# Patient Record
Sex: Male | Born: 1980
Health system: Southern US, Community
[De-identification: ages and names within clinical notes are randomized; demographics above are authoritative.]

---

## 2005-02-05 ENCOUNTER — Emergency Department (HOSPITAL_COMMUNITY): Admission: EM | Admit: 2005-02-05 | Discharge: 2005-02-05 | Payer: Self-pay | Admitting: Emergency Medicine
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
## 2011-04-13 ENCOUNTER — Encounter: Payer: Self-pay | Admitting: Vascular Surgery

## 2011-04-14 ENCOUNTER — Ambulatory Visit (INDEPENDENT_AMBULATORY_CARE_PROVIDER_SITE_OTHER): Payer: BC Managed Care – PPO | Admitting: *Deleted

## 2011-04-14 ENCOUNTER — Ambulatory Visit (INDEPENDENT_AMBULATORY_CARE_PROVIDER_SITE_OTHER): Payer: BC Managed Care – PPO | Admitting: Vascular Surgery

## 2011-04-14 ENCOUNTER — Encounter: Payer: Self-pay | Admitting: Vascular Surgery

## 2011-04-14 VITALS — BP 117/76 | HR 67 | Resp 16 | Ht 70.0 in | Wt 169.0 lb

## 2011-04-14 DIAGNOSIS — M7989 Other specified soft tissue disorders: Secondary | ICD-10-CM

## 2011-04-14 DIAGNOSIS — M79609 Pain in unspecified limb: Secondary | ICD-10-CM

## 2011-04-14 DIAGNOSIS — I83893 Varicose veins of bilateral lower extremities with other complications: Secondary | ICD-10-CM | POA: Insufficient documentation

## 2011-04-14 NOTE — Progress Notes (Signed)
Subjective:     Patient ID: John Bush, male   DOB: 11-Jul-1980, 31 y.o.   MRN: [id number]  HPI is 31 year old healthy male has noticed bulging varicosities in his left calf over the last 3-4 years which have increased in size and become increasingly symptomatic. He describes aching, throbbing, burning, and staining discomfort in the left calf which worsens as the day progresses. It is not wear elastic compression stockings. He has noticed progressive swelling in the left calf compared to the right side over the past few years. He has no history of DVT, thrombophlebitis, stasis ulcers, bleeding, or other complications. He has no symptoms in the contralateral right leg. He does not take pain medication. His job requires him to be on his feet all day and his symptoms continued to worsen since he is unable to elevate his legs.  History reviewed. No pertinent past medical history.  History  Substance Use Topics  . Smoking status: Never Smoker   . Smokeless tobacco: Not on file  . Alcohol Use: Yes    Family History  Problem Relation Age of Onset  . Hyperlipidemia Father   . Hypertension Father     Not on File  No current outpatient prescriptions on file.  BP 117/76  Pulse 67  Resp 16  Ht 5\' 10"  (1.778 m)  Wt 169 lb (76.658 kg)  BMI 24.25 kg/m2  SpO2 100%  Body mass index is 24.25 kg/(m^2).          Review of Systems denies chest pain, dyspnea on exertion, PND, orthopnea, hemoptysis, claudication, all neurologic symptoms-complete negative review of systems     Objective:   Physical Exam pressure 170/76 heart rate 67 respirations 16 Gen.-alert and oriented x3 in no apparent distress HEENT normal for age Lungs no rhonchi or wheezing Cardiovascular regular rhythm no murmurs carotid pulses 3+ palpable no bruits audible Abdomen soft nontender no palpable masses Musculoskeletal free of  major deformities Skin clear -no rashes Neurologic normal Lower extremities 3+ femoral and  dorsalis pedis pulses palpable bilaterally with no edema in right leg. Left calf 1/2 cm larger in circumference than right calf. Bulging varicosities in left medial calf between the knee and ankle extending down distally to a point to 6 cm proximal to the medial malleolus. No active ulceration is noted. There is no hyperpigmentation noted.  Today I ordered a venous duplex exam of the left leg which are reviewed and interpreted. There is some reflux in the deep system which is mild. There is no DVT. There is gross reflux in the great saphenous system supplying these varicosities mentioned above. This extends from the greater saphenous junction to the knee.      Assessment:     Severe venous insufficiency left leg with reflux left great saphenous system supplying symptomatic varicosities in the left calf and distal leg which are affecting the patient's daily living and ability to work because of increased symptoms    Plan:     #1 long-leg elastic compression stockings 20-30 mm gradient #2 elevate legs as much as possible-will not be possible during daytime hours while at work #3 ibuprofen on a daily basis #4 return in 3 months if there's been no improvement feel patient should be treated with #1 laser ablation left great saphenous vein with 10-20 stab phlebectomy

## 2011-04-21 NOTE — Procedures (Unsigned)
LOWER EXTREMITY VENOUS REFLUX EXAM  INDICATION:  Left lower extremity pain.  EXAM:  Using color-flow imaging and pulse Doppler spectral analysis, the left common femoral, femoral, popliteal, posterior tibial, great and small saphenous veins were evaluated.  There is evidence suggesting deep venous Reflux of >528milliseconds noted throughout the left lower extremity.  The left saphenofemoral junction and non-tortuous left great saphenous vein demonstrate Reflux of >562milliseconds.  The left proximal small saphenous vein demonstrates competency.  GSV Diameter (used if found to be incompetent only)                                           Right    Left Proximal Greater Saphenous Vein           cm       0.51 cm Proximal-to-mid-thigh                     cm       cm Mid thigh                                 cm       0.42 cm Mid-distal thigh                          cm       0.44 cm Distal thigh                              cm       cm Knee                                      cm       0.29 cm  IMPRESSION:  Reflux of >573milliseconds noted throughout the left lower extremity deep venous system and the left great saphenous vein.  ___________________________________________ Quita Skye Hart Rochester, M.D.  CH/MEDQ  D:  04/16/2011  T:  04/16/2011  Job:  [id number]

## 2011-07-10 ENCOUNTER — Encounter: Payer: Self-pay | Admitting: Vascular Surgery

## 2011-07-13 ENCOUNTER — Ambulatory Visit (INDEPENDENT_AMBULATORY_CARE_PROVIDER_SITE_OTHER): Payer: BC Managed Care – PPO | Admitting: Vascular Surgery

## 2011-07-13 ENCOUNTER — Encounter: Payer: Self-pay | Admitting: Vascular Surgery

## 2011-07-13 VITALS — BP 146/79 | HR 67 | Resp 18 | Ht 70.0 in | Wt 165.0 lb

## 2011-07-13 DIAGNOSIS — I83893 Varicose veins of bilateral lower extremities with other complications: Secondary | ICD-10-CM

## 2011-07-13 NOTE — Progress Notes (Signed)
Subjective:     Patient ID: John Bush, male   DOB: [birth year], 31 y.o.   MRN: [id number]  HPI this 31 year old healthy male returns today with persistent symptoms of aching throbbing and burning discomfort in his left leg due to his venous insufficiency. He has demonstrated gross reflux in the left great saphenous vein with enlarging bulging varicosities in the calf both proximally and distally. He also has chronic edema in the left ankle. He has no history of DVT or thrombophlebitis or bleeding or stasis ulcers. He has been wearing long leg elastic compression stockings 20-30 mm gradient as well as trying elevation and ibuprofen with no success. Symptoms are affecting his daily living. No past medical history on file.  History  Substance Use Topics  . Smoking status: Never Smoker   . Smokeless tobacco: Not on file  . Alcohol Use: Yes    Family History  Problem Relation Age of Onset  . Hyperlipidemia Father   . Hypertension Father     No Known Allergies  Current outpatient prescriptions:minocycline (MINOCIN,DYNACIN) 100 MG capsule, Take 100 mg by mouth 2 (two) times daily., Disp: , Rfl:   BP 146/79  Pulse 67  Resp 18  Ht 5\' 10"  (1.778 m)  Wt 165 lb (74.844 kg)  BMI 23.68 kg/m2  Body mass index is 23.68 kg/(m^2).           Review of Systems denies chest pain, dyspnea on exertion, PND, orthopnea, hemoptysis, lateralizing neurologic symptoms      Objective:   Physical Exam blood pressure 146/79 heart rate 67 respirations 18 General well-developed well-nourished male no apparent stress alert and oriented x3 Lungs no rhonchi or wheezing Left lower extremity with large bulging varicosities in the medial calf involving the great saphenous system with secondary varicosities extending down toward the medial malleolus both anteriorly and posteriorly. There is 1+ edema. There is no hyperpigmentation or active ulceration. He does have 3+ dorsalis pedis and posterior tibial pulses  palpable.     Assessment:     Severe venous insufficiency with gross reflux left great saphenous system due to valvular incompetence causing bulging symptomatic varicosities    Plan:     Patient needs treatment consisting of laser ablation left great saphenous vein with 10-20 stab phlebectomy as single procedure. Will proceed with precertification to perform this in the near future to relieve his symptoms

## 2011-08-14 ENCOUNTER — Encounter: Payer: Self-pay | Admitting: Vascular Surgery

## 2011-08-17 ENCOUNTER — Ambulatory Visit (INDEPENDENT_AMBULATORY_CARE_PROVIDER_SITE_OTHER): Payer: BC Managed Care – PPO | Admitting: Vascular Surgery

## 2011-08-17 ENCOUNTER — Encounter: Payer: Self-pay | Admitting: Vascular Surgery

## 2011-08-17 VITALS — BP 151/87 | HR 67 | Resp 16 | Ht 70.0 in | Wt 165.0 lb

## 2011-08-17 DIAGNOSIS — Z48812 Encounter for surgical aftercare following surgery on the circulatory system: Secondary | ICD-10-CM

## 2011-08-17 DIAGNOSIS — I83893 Varicose veins of bilateral lower extremities with other complications: Secondary | ICD-10-CM

## 2011-08-17 HISTORY — PX: ENDOVENOUS ABLATION SAPHENOUS VEIN W/ LASER: SUR449

## 2011-08-17 NOTE — Progress Notes (Signed)
Subjective:     Patient ID: John Bush, male   DOB: 1980/09/25, 31 y.o.   MRN: [id number]  HPI this 31 year old male had laser ablation of the left great saphenous vein performed under local tumescent anesthesia with a total of the thousand and 70 J of energy utilized. He tolerated the procedure well. He also had 10-20 stab phlebectomy of secondary varicosities performed   Review of Systems     Objective:   Physical ExamBP 151/87  Pulse 67  Resp 16  Ht 5\' 10"  (1.778 m)  Wt 165 lb (74.844 kg)  BMI 23.68 kg/m2       Assessment:     Well-tolerated laser ablation left great saphenous vein with 10-20 stab phlebectomy performed under local tumescent anesthesia    Plan:     Return in 2 weeks for venous duplex exam to confirm closure left great saphenous vein. This will complete treatment regimen

## 2011-08-17 NOTE — Progress Notes (Signed)
Laser Ablation Procedure      Date: 08/17/2011    John Bush DOB:Nov 30, 1980  Consent signed: Yes  Surgeon:J.D. Hart Rochester  Procedure: Laser Ablation: left Greater Saphenous Vein  BP 151/87  Pulse 67  Resp 16  Ht 5\' 10"  (1.778 m)  Wt 165 lb (74.844 kg)  BMI 23.68 kg/m2  Start time: 1:05   End time: 2:20  Tumescent Anesthesia: 300 cc 0.9% NaCl with 50 cc Lidocaine HCL with 1% Epi and 15 cc 8.4% NaHCO3  Local Anesthesia: 16 cc Lidocaine HCL and NaHCO3 (ratio 2:1)  Pulsed mode: Watts 15 Seconds 1 Pulses:1 Total Pulses:72 Total Energy: 1073 Total Time: 1:11     Stab Phlebectomy: 10-20 Sites: Calf  Patient tolerated procedure well: Yes  Notes:   Description of Procedure:  After marking the course of the saphenous vein and the secondary varicosities in the standing position, the patient was placed on the operating table in the supine position, and the left leg was prepped and draped in sterile fashion. Local anesthetic was administered, and under ultrasound guidance the saphenous vein was accessed with a micro needle and guide wire; then the micro puncture sheath was placed. A guide wire was inserted to the saphenofemoral junction, followed by a 5 french sheath.  The position of the sheath and then the laser fiber below the junction was confirmed using the ultrasound and visualization of the aiming beam.  Tumescent anesthesia was administered along the course of the saphenous vein using ultrasound guidance. Protective laser glasses were placed on the patient, and the laser was fired at 15 watt pulsed mode advancing 1-2 mm per sec.  For a total of 1073 joules.  A steri strip was applied to the puncture site.  The patient was then put into Trendelenburg position.  Local anesthetic was utilized overlying the marked varicosities.  Greater than 10-20 stab wounds were made using the tip of an 11 blade; and using the vein hook,  The phlebectomies were performed using a hemostat to avulse these  varicosities.  Adequate hemostasis was achieved, and steri strips were applied to the stab wound.      ABD pads and thigh high compression stockings were applied.  Ace wrap bandages were applied over the phlebectomy sites and at the top of the saphenofemoral junction.  Blood loss was less than 15 cc.  The patient ambulated out of the operating room having tolerated the procedure well.

## 2011-08-18 ENCOUNTER — Encounter: Payer: Self-pay | Admitting: Vascular Surgery

## 2011-08-18 NOTE — Addendum Note (Signed)
Addended by: Sharee Pimple on: 08/18/2011 08:23 AM   Modules accepted: Orders

## 2011-08-24 ENCOUNTER — Encounter: Payer: Self-pay | Admitting: Vascular Surgery

## 2011-08-25 ENCOUNTER — Encounter (INDEPENDENT_AMBULATORY_CARE_PROVIDER_SITE_OTHER): Payer: BC Managed Care – PPO | Admitting: *Deleted

## 2011-08-25 ENCOUNTER — Ambulatory Visit (INDEPENDENT_AMBULATORY_CARE_PROVIDER_SITE_OTHER): Payer: BC Managed Care – PPO | Admitting: Vascular Surgery

## 2011-08-25 ENCOUNTER — Encounter: Payer: Self-pay | Admitting: Vascular Surgery

## 2011-08-25 VITALS — BP 129/72 | HR 62 | Resp 18 | Ht 70.0 in | Wt 166.9 lb

## 2011-08-25 DIAGNOSIS — I83893 Varicose veins of bilateral lower extremities with other complications: Secondary | ICD-10-CM

## 2011-08-25 DIAGNOSIS — Z48812 Encounter for surgical aftercare following surgery on the circulatory system: Secondary | ICD-10-CM

## 2011-08-25 NOTE — Progress Notes (Signed)
Patient presents today one week followup after her laser ablation of left great saphenous vein and stab phlebectomy of multiple tributary varicosities by Dr. Hart [place]. He has the typical amount of bruising and erythema on the medial aspect of his left thigh with minimal discomfort in his phlebectomy sites.  Venous duplex today reveals closure of his great saphenous vein from the insertion site to just below the saphenofemoral junction and no evidence of DVT. The patient was reassured with this and will followup with [place] on an as-needed basis

## 2018-03-11 DIAGNOSIS — J0140 Acute pansinusitis, unspecified: Secondary | ICD-10-CM | POA: Diagnosis not present

## 2019-03-17 DIAGNOSIS — Z0000 Encounter for general adult medical examination without abnormal findings: Secondary | ICD-10-CM | POA: Diagnosis not present

## 2019-03-17 DIAGNOSIS — Z13220 Encounter for screening for lipoid disorders: Secondary | ICD-10-CM | POA: Diagnosis not present

## 2019-04-28 DIAGNOSIS — F419 Anxiety disorder, unspecified: Secondary | ICD-10-CM | POA: Diagnosis not present

## 2019-06-29 DIAGNOSIS — L814 Other melanin hyperpigmentation: Secondary | ICD-10-CM | POA: Diagnosis not present

## 2019-06-29 DIAGNOSIS — D224 Melanocytic nevi of scalp and neck: Secondary | ICD-10-CM | POA: Diagnosis not present

## 2019-06-29 DIAGNOSIS — B360 Pityriasis versicolor: Secondary | ICD-10-CM | POA: Diagnosis not present

## 2019-06-29 DIAGNOSIS — L578 Other skin changes due to chronic exposure to nonionizing radiation: Secondary | ICD-10-CM | POA: Diagnosis not present

## 2019-11-01 DIAGNOSIS — F419 Anxiety disorder, unspecified: Secondary | ICD-10-CM | POA: Diagnosis not present

## 2019-11-24 DIAGNOSIS — F419 Anxiety disorder, unspecified: Secondary | ICD-10-CM | POA: Diagnosis not present

## 2020-02-02 DIAGNOSIS — F419 Anxiety disorder, unspecified: Secondary | ICD-10-CM | POA: Diagnosis not present

## 2020-03-11 DIAGNOSIS — U071 COVID-19: Secondary | ICD-10-CM | POA: Diagnosis not present

## 2020-03-11 DIAGNOSIS — Z20822 Contact with and (suspected) exposure to covid-19: Secondary | ICD-10-CM | POA: Diagnosis not present

## 2020-04-30 ENCOUNTER — Other Ambulatory Visit: Payer: Self-pay

## 2020-04-30 ENCOUNTER — Ambulatory Visit (INDEPENDENT_AMBULATORY_CARE_PROVIDER_SITE_OTHER): Payer: BC Managed Care – PPO

## 2020-04-30 ENCOUNTER — Ambulatory Visit (INDEPENDENT_AMBULATORY_CARE_PROVIDER_SITE_OTHER): Payer: BC Managed Care – PPO | Admitting: Podiatry

## 2020-04-30 DIAGNOSIS — G5762 Lesion of plantar nerve, left lower limb: Secondary | ICD-10-CM | POA: Diagnosis not present

## 2020-04-30 DIAGNOSIS — G5782 Other specified mononeuropathies of left lower limb: Secondary | ICD-10-CM

## 2020-04-30 DIAGNOSIS — M778 Other enthesopathies, not elsewhere classified: Secondary | ICD-10-CM

## 2020-04-30 DIAGNOSIS — M205X2 Other deformities of toe(s) (acquired), left foot: Secondary | ICD-10-CM

## 2020-04-30 MED ORDER — TRIAMCINOLONE ACETONIDE 40 MG/ML IJ SUSP
20.0000 mg | Freq: Once | INTRAMUSCULAR | Status: AC
  Administered 2020-04-30: 20 mg

## 2020-04-30 NOTE — Progress Notes (Signed)
  Subjective:  Patient ID: John Bush, male    DOB: [birth year],  MRN: [id number] HPI Chief Complaint  Patient presents with  . Foot Pain    PT states that he has left plantar foot pain that has been persistent for 6 months.     40 y.o. male presents with the above complaint.   ROS: Denies fever chills nausea vomiting muscle aches pains calf pain back pain chest pain shortness of breath.  No past medical history on file. Past Surgical History:  Procedure Laterality Date  . ENDOVENOUS ABLATION SAPHENOUS VEIN W/ LASER  08-17-2011   left greater saphenous vein  by Dr. Josephina Gip     Current Outpatient Medications:  .  minocycline (MINOCIN,DYNACIN) 100 MG capsule, Take 100 mg by mouth 2 (two) times daily., Disp: , Rfl:   No Known Allergies Review of Systems Objective:  There were no vitals filed for this visit.  General: Well developed, nourished, in no acute distress, alert and oriented x3   Dermatological: Skin is warm, dry and supple bilateral. Nails x 10 are well maintained; remaining integument appears unremarkable at this time. There are no open sores, no preulcerative lesions, no rash or signs of infection present.  Vascular: Dorsalis Pedis artery and Posterior Tibial artery pedal pulses are 2/4 bilateral with immedate capillary fill time. Pedal hair growth present. No varicosities and no lower extremity edema present bilateral.   Neruologic: Grossly intact via light touch bilateral. Vibratory intact via tuning fork bilateral. Protective threshold with Semmes Wienstein monofilament intact to all pedal sites bilateral. Patellar and Achilles deep tendon reflexes 2+ bilateral. No Babinski or clonus noted bilateral.   Musculoskeletal: No gross boney pedal deformities bilateral. No pain, crepitus, or limitation noted with foot and ankle range of motion bilateral. Muscular strength 5/5 in all groups tested bilateral.  Mild hallux limitus left.  Palpable neuroma third interspace left  foot.  Gait: Unassisted, Nonantalgic.    Radiographs:  Radiographs demonstrate osseously mature individual no significant acute findings.  No soft tissue swelling identified.  Assessment & Plan:   Assessment: Neuroma third interspace left foot.  Restricted range of motion or hallux limitus first metatarsophalangeal joint left capsulitis.  Plan: Discussed etiology pathology conservative surgical therapies I injected the neuroma today 10 mg Kenalog 5 mg Marcaine point maximal tenderness.  Follow-up with him in 1 month if necessary discussed appropriate shoe gear.     John Bush T. [place], [place]

## 2020-06-11 ENCOUNTER — Encounter: Payer: Self-pay | Admitting: Podiatry

## 2020-06-11 ENCOUNTER — Other Ambulatory Visit: Payer: Self-pay

## 2020-06-11 ENCOUNTER — Ambulatory Visit: Payer: BC Managed Care – PPO | Admitting: Podiatry

## 2020-06-11 DIAGNOSIS — M778 Other enthesopathies, not elsewhere classified: Secondary | ICD-10-CM | POA: Diagnosis not present

## 2020-06-11 MED ORDER — DEXAMETHASONE SODIUM PHOSPHATE 120 MG/30ML IJ SOLN
2.0000 mg | Freq: Once | INTRAMUSCULAR | Status: AC
  Administered 2020-06-11: 2 mg via INTRA_ARTICULAR

## 2020-06-11 NOTE — Progress Notes (Signed)
He presents today for follow-up of his neuroma third interdigital space of his left foot states that it is 100% improved.  Still has tenderness in the hallux interphalangeal joint and limitation on range of motion of the first metatarsophalangeal joint.  Objective: Vital signs are stable he is alert oriented x3 there is no erythema edema cellulitis drainage or odor no reproducible pain no Mulder's click on palpation to the third interdigital space of the left foot.  Assessment capsulitis of the first metatarsal phalangeal joint and of the hallux interphalangeal joint left foot.  There is resolving neuroma third interdigital space.  Plan: I injected dexamethasone local anesthetic extremity and skin prep 4 mg into the first metatarsal phalangeal joint.  Follow-up with me as needed

## 2020-08-20 DIAGNOSIS — M79645 Pain in left finger(s): Secondary | ICD-10-CM | POA: Diagnosis not present

## 2020-08-20 DIAGNOSIS — F419 Anxiety disorder, unspecified: Secondary | ICD-10-CM | POA: Diagnosis not present

## 2020-11-01 DIAGNOSIS — R030 Elevated blood-pressure reading, without diagnosis of hypertension: Secondary | ICD-10-CM | POA: Diagnosis not present

## 2021-01-02 DIAGNOSIS — R0981 Nasal congestion: Secondary | ICD-10-CM | POA: Diagnosis not present

## 2021-01-02 DIAGNOSIS — J029 Acute pharyngitis, unspecified: Secondary | ICD-10-CM | POA: Diagnosis not present

## 2021-01-02 DIAGNOSIS — Z03818 Encounter for observation for suspected exposure to other biological agents ruled out: Secondary | ICD-10-CM | POA: Diagnosis not present

## 2021-01-02 DIAGNOSIS — R519 Headache, unspecified: Secondary | ICD-10-CM | POA: Diagnosis not present

## 2021-02-21 DIAGNOSIS — F32A Depression, unspecified: Secondary | ICD-10-CM | POA: Diagnosis not present

## 2021-02-21 DIAGNOSIS — F419 Anxiety disorder, unspecified: Secondary | ICD-10-CM | POA: Diagnosis not present

## 2021-07-03 DIAGNOSIS — R233 Spontaneous ecchymoses: Secondary | ICD-10-CM | POA: Diagnosis not present

## 2021-07-03 DIAGNOSIS — R21 Rash and other nonspecific skin eruption: Secondary | ICD-10-CM | POA: Diagnosis not present

## 2021-07-16 DIAGNOSIS — I788 Other diseases of capillaries: Secondary | ICD-10-CM | POA: Diagnosis not present

## 2021-07-16 DIAGNOSIS — L700 Acne vulgaris: Secondary | ICD-10-CM | POA: Diagnosis not present

## 2021-07-30 DIAGNOSIS — H43312 Vitreous membranes and strands, left eye: Secondary | ICD-10-CM | POA: Diagnosis not present

## 2022-04-15 DIAGNOSIS — R030 Elevated blood-pressure reading, without diagnosis of hypertension: Secondary | ICD-10-CM | POA: Diagnosis not present

## 2022-04-15 DIAGNOSIS — F411 Generalized anxiety disorder: Secondary | ICD-10-CM | POA: Diagnosis not present

## 2022-04-15 DIAGNOSIS — F32A Depression, unspecified: Secondary | ICD-10-CM | POA: Diagnosis not present

## 2022-06-01 DIAGNOSIS — Z23 Encounter for immunization: Secondary | ICD-10-CM | POA: Diagnosis not present

## 2022-06-01 DIAGNOSIS — S61012A Laceration without foreign body of left thumb without damage to nail, initial encounter: Secondary | ICD-10-CM | POA: Diagnosis not present

## 2022-06-01 DIAGNOSIS — X58XXXA Exposure to other specified factors, initial encounter: Secondary | ICD-10-CM | POA: Diagnosis not present

## 2023-01-28 DIAGNOSIS — H66001 Acute suppurative otitis media without spontaneous rupture of ear drum, right ear: Secondary | ICD-10-CM | POA: Diagnosis not present

## 2023-01-28 DIAGNOSIS — Z6830 Body mass index (BMI) 30.0-30.9, adult: Secondary | ICD-10-CM | POA: Diagnosis not present

## 2023-01-28 DIAGNOSIS — J0140 Acute pansinusitis, unspecified: Secondary | ICD-10-CM | POA: Diagnosis not present

## 2023-01-28 DIAGNOSIS — R030 Elevated blood-pressure reading, without diagnosis of hypertension: Secondary | ICD-10-CM | POA: Diagnosis not present

## 2023-02-05 DIAGNOSIS — R030 Elevated blood-pressure reading, without diagnosis of hypertension: Secondary | ICD-10-CM | POA: Diagnosis not present

## 2023-02-05 DIAGNOSIS — H6501 Acute serous otitis media, right ear: Secondary | ICD-10-CM | POA: Diagnosis not present

## 2023-02-22 DIAGNOSIS — R509 Fever, unspecified: Secondary | ICD-10-CM | POA: Diagnosis not present

## 2023-02-22 DIAGNOSIS — R051 Acute cough: Secondary | ICD-10-CM | POA: Diagnosis not present

## 2023-02-22 DIAGNOSIS — R030 Elevated blood-pressure reading, without diagnosis of hypertension: Secondary | ICD-10-CM | POA: Diagnosis not present

## 2023-02-22 DIAGNOSIS — H66001 Acute suppurative otitis media without spontaneous rupture of ear drum, right ear: Secondary | ICD-10-CM | POA: Diagnosis not present

## 2023-03-05 DIAGNOSIS — I788 Other diseases of capillaries: Secondary | ICD-10-CM | POA: Diagnosis not present

## 2023-03-05 DIAGNOSIS — R030 Elevated blood-pressure reading, without diagnosis of hypertension: Secondary | ICD-10-CM | POA: Diagnosis not present

## 2023-03-05 DIAGNOSIS — E7800 Pure hypercholesterolemia, unspecified: Secondary | ICD-10-CM | POA: Diagnosis not present

## 2023-03-05 DIAGNOSIS — F32A Depression, unspecified: Secondary | ICD-10-CM | POA: Diagnosis not present

## 2023-03-05 DIAGNOSIS — F419 Anxiety disorder, unspecified: Secondary | ICD-10-CM | POA: Diagnosis not present

## 2023-03-05 DIAGNOSIS — Z0000 Encounter for general adult medical examination without abnormal findings: Secondary | ICD-10-CM | POA: Diagnosis not present

## 2023-09-06 DIAGNOSIS — F419 Anxiety disorder, unspecified: Secondary | ICD-10-CM | POA: Diagnosis not present

## 2023-09-06 DIAGNOSIS — F32A Depression, unspecified: Secondary | ICD-10-CM | POA: Diagnosis not present

## 2023-09-06 DIAGNOSIS — R030 Elevated blood-pressure reading, without diagnosis of hypertension: Secondary | ICD-10-CM | POA: Diagnosis not present

## 2023-09-06 DIAGNOSIS — E785 Hyperlipidemia, unspecified: Secondary | ICD-10-CM | POA: Diagnosis not present

## 2023-11-25 DIAGNOSIS — J029 Acute pharyngitis, unspecified: Secondary | ICD-10-CM | POA: Diagnosis not present

## 2023-11-25 DIAGNOSIS — Z6830 Body mass index (BMI) 30.0-30.9, adult: Secondary | ICD-10-CM | POA: Diagnosis not present

## 2023-11-25 DIAGNOSIS — R030 Elevated blood-pressure reading, without diagnosis of hypertension: Secondary | ICD-10-CM | POA: Diagnosis not present

## 2023-11-25 DIAGNOSIS — Z20822 Contact with and (suspected) exposure to covid-19: Secondary | ICD-10-CM | POA: Diagnosis not present

## 2023-11-30 DIAGNOSIS — L700 Acne vulgaris: Secondary | ICD-10-CM | POA: Diagnosis not present

## 2023-12-21 DIAGNOSIS — E785 Hyperlipidemia, unspecified: Secondary | ICD-10-CM | POA: Diagnosis not present
